# Patient Record
Sex: Male | Born: 1985 | Race: White | Hispanic: No | Marital: Single
Health system: Southern US, Community
[De-identification: ages and names within clinical notes are randomized; demographics above are authoritative.]

## PROBLEM LIST (undated history)

## (undated) DIAGNOSIS — F419 Anxiety disorder, unspecified: Secondary | ICD-10-CM

## (undated) DIAGNOSIS — F32A Depression, unspecified: Secondary | ICD-10-CM

## (undated) HISTORY — PX: WISDOM TOOTH EXTRACTION: SHX21

---

## 2004-12-11 ENCOUNTER — Ambulatory Visit (HOSPITAL_COMMUNITY): Admission: RE | Admit: 2004-12-11 | Discharge: 2004-12-11 | Payer: Self-pay | Admitting: Internal Medicine

## 2008-10-26 ENCOUNTER — Emergency Department (HOSPITAL_COMMUNITY): Admission: EM | Admit: 2008-10-26 | Discharge: 2008-10-26 | Payer: Self-pay | Admitting: Emergency Medicine

## 2010-04-22 IMAGING — CR DG FOOT COMPLETE 3+V*L*
3 series · 3 of 3 positions shown · non-contrast
Comparison: None

CLINICAL DATA: Fell 10/25/2008.  Pain and swelling.

LEFT FOOT - COMPLETE 3+ VIEW

[t foot ap left]
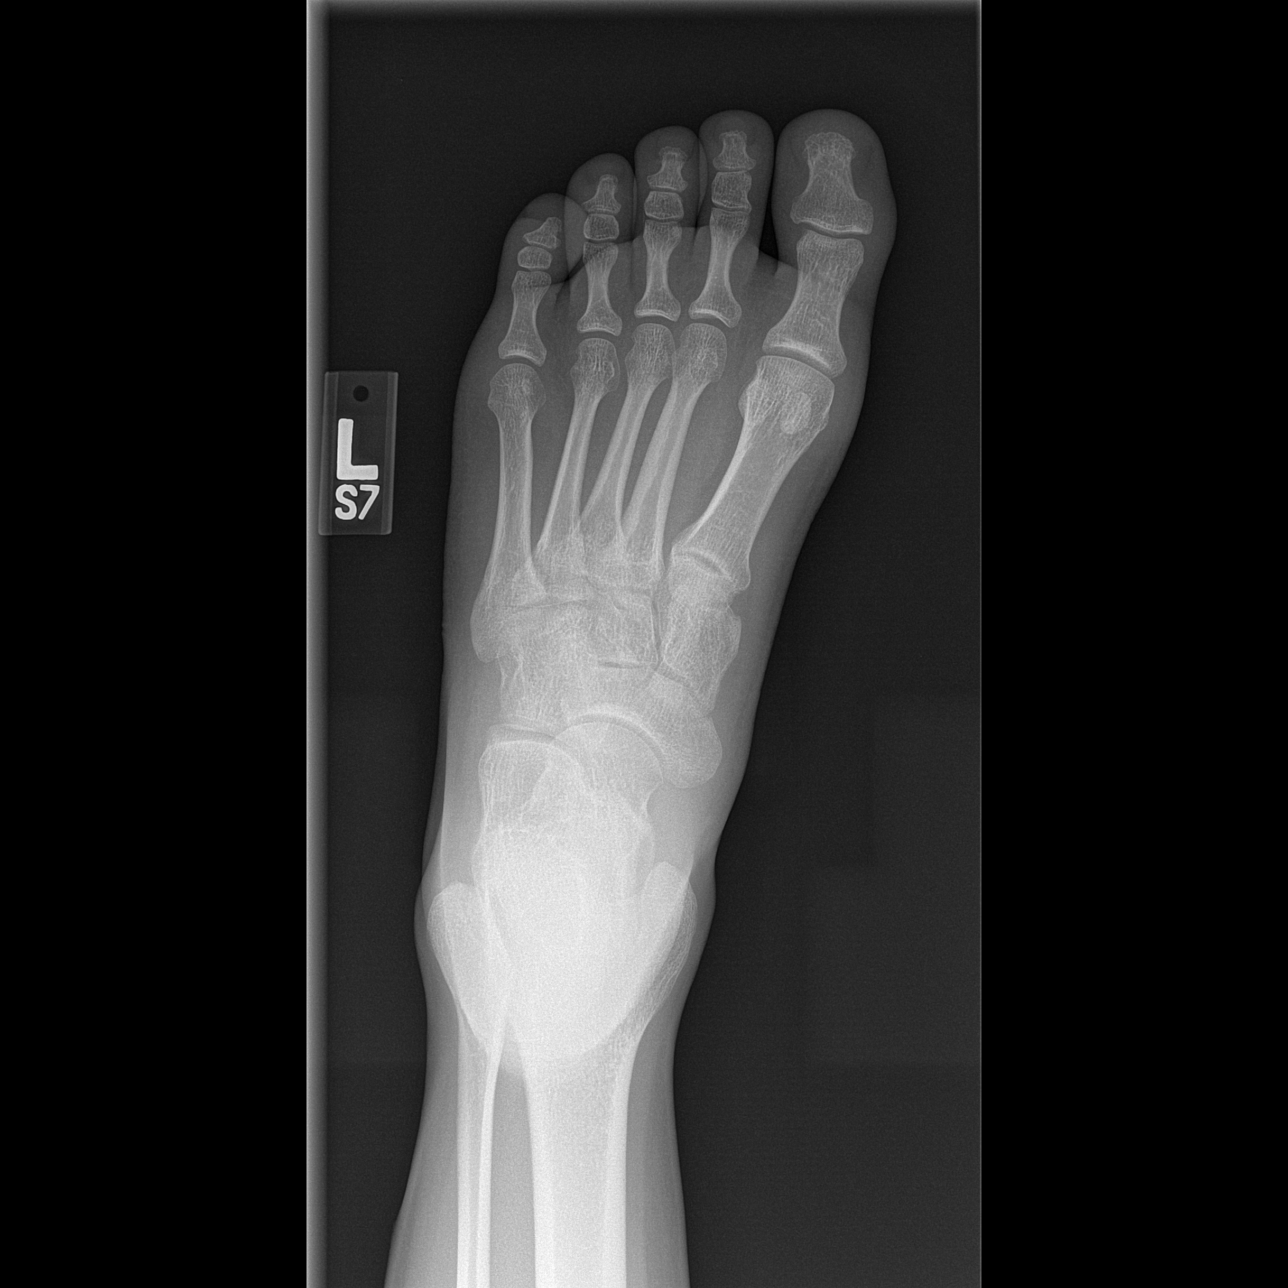

[t foot oblique left]
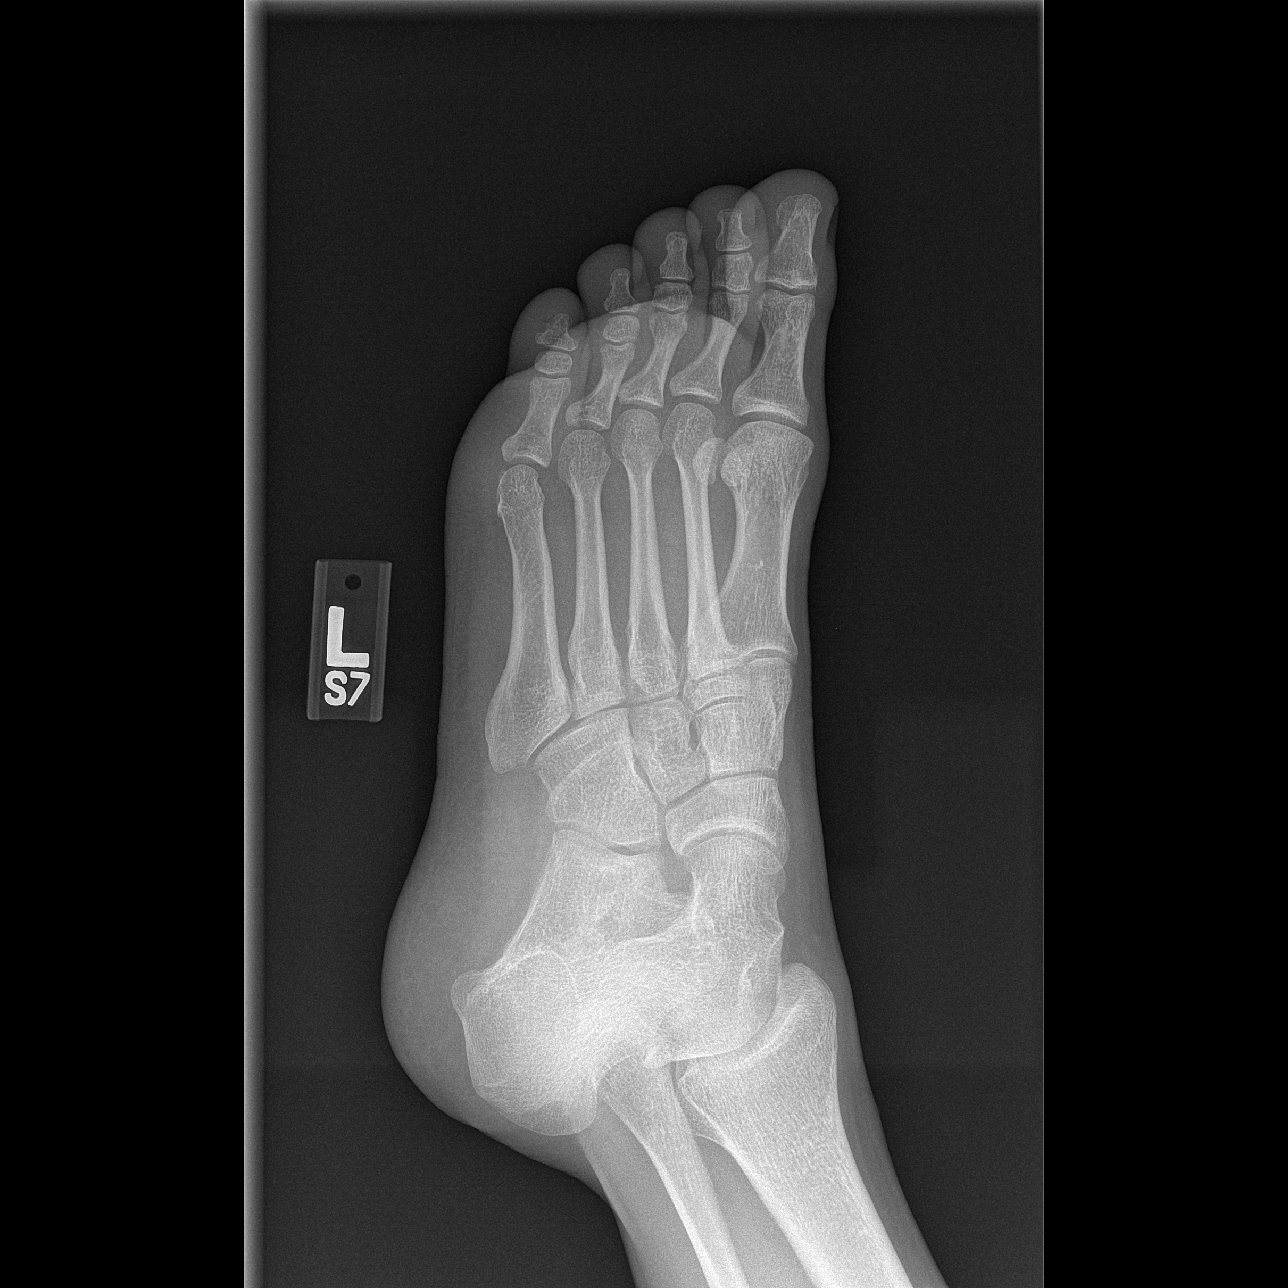

[t foot lat left]
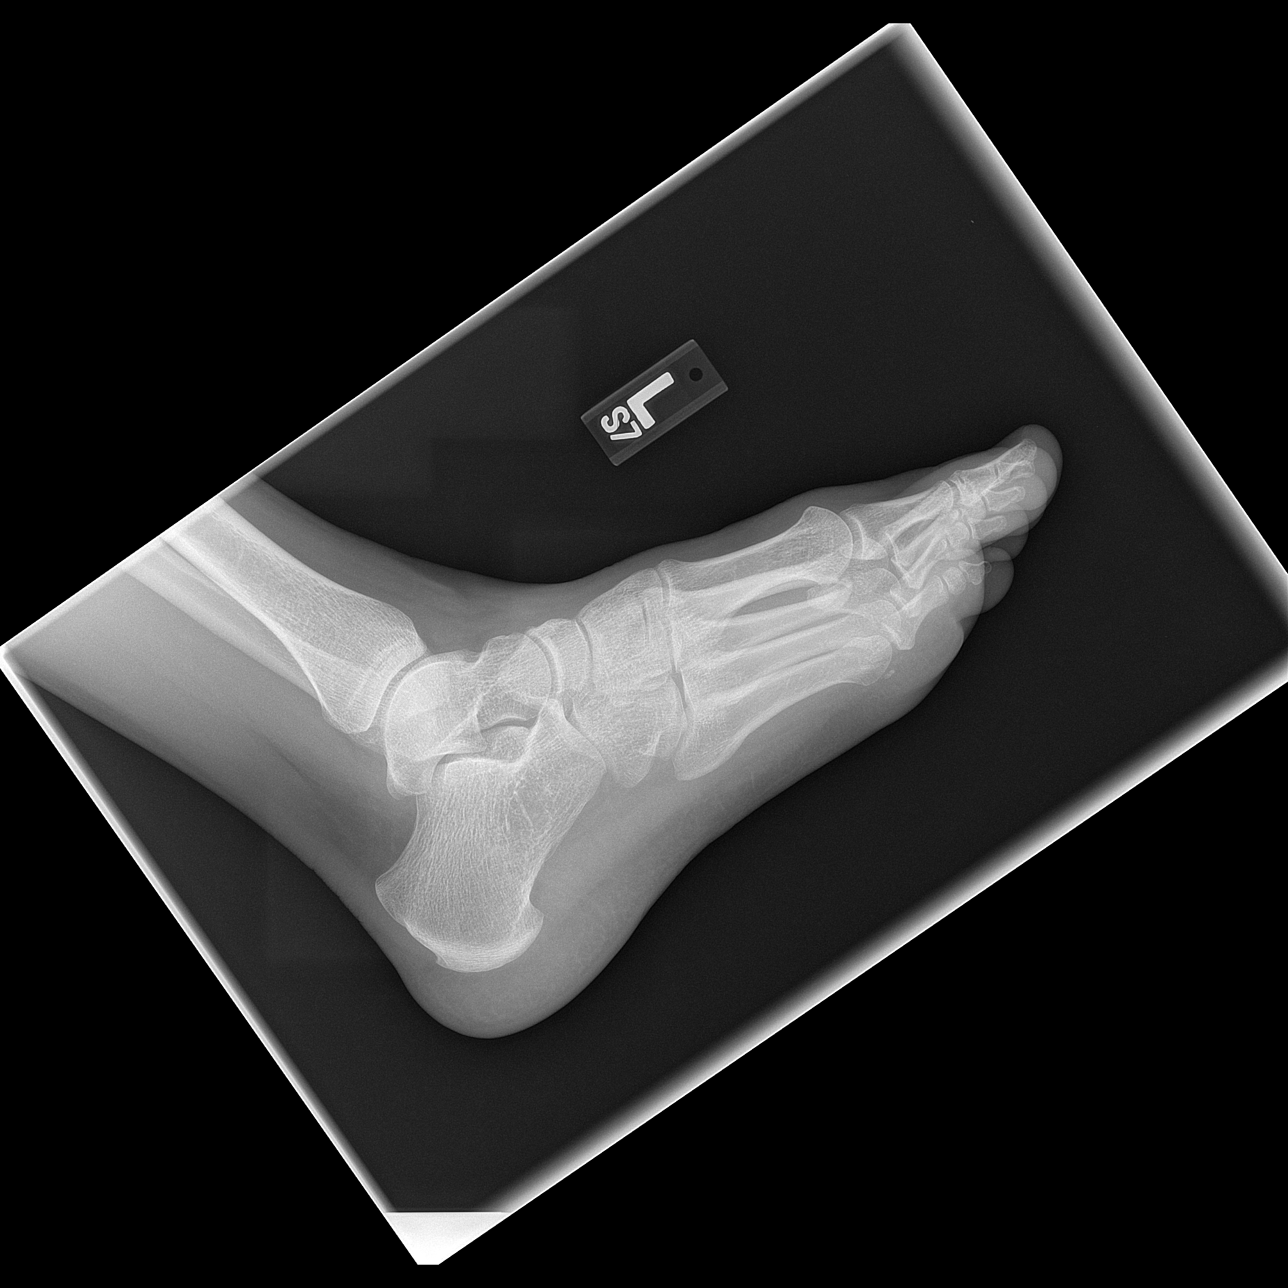

[3 of 3 positions shown; findings below may reference images not displayed]

FINDINGS: No fracture or subluxation.
IMPRESSION: Negative left foot.

## 2011-02-27 ENCOUNTER — Emergency Department (HOSPITAL_COMMUNITY)
Admission: EM | Admit: 2011-02-27 | Discharge: 2011-02-28 | Disposition: A | Discharge status: Home / Self Care | Payer: Self-pay | Attending: Emergency Medicine | Admitting: Emergency Medicine

## 2011-02-27 DIAGNOSIS — J351 Hypertrophy of tonsils: Secondary | ICD-10-CM | POA: Insufficient documentation

## 2011-02-27 DIAGNOSIS — R509 Fever, unspecified: Secondary | ICD-10-CM | POA: Insufficient documentation

## 2011-02-27 DIAGNOSIS — J020 Streptococcal pharyngitis: Secondary | ICD-10-CM | POA: Insufficient documentation

## 2011-02-27 DIAGNOSIS — R350 Frequency of micturition: Secondary | ICD-10-CM | POA: Insufficient documentation

## 2011-02-27 DIAGNOSIS — R109 Unspecified abdominal pain: Secondary | ICD-10-CM | POA: Insufficient documentation

## 2011-02-27 DIAGNOSIS — M549 Dorsalgia, unspecified: Secondary | ICD-10-CM | POA: Insufficient documentation

## 2011-02-28 ENCOUNTER — Emergency Department (HOSPITAL_COMMUNITY): Payer: Self-pay

## 2011-02-28 LAB — POCT I-STAT, CHEM 8
BUN: 10 mg/dL (ref 6–23)
Calcium, Ion: 1.15 mmol/L (ref 1.12–1.32)
Chloride: 101 mEq/L (ref 96–112)
Glucose, Bld: 101 mg/dL — ABNORMAL HIGH (ref 70–99)
Potassium: 3.1 mEq/L — ABNORMAL LOW (ref 3.5–5.1)

## 2011-02-28 LAB — URINALYSIS, ROUTINE W REFLEX MICROSCOPIC
Bilirubin Urine: NEGATIVE
Glucose, UA: NEGATIVE mg/dL
Ketones, ur: 15 mg/dL — AB
Leukocytes, UA: NEGATIVE
Specific Gravity, Urine: 1.03 (ref 1.005–1.030)
pH: 8 (ref 5.0–8.0)

## 2011-02-28 LAB — RAPID STREP SCREEN (MED CTR MEBANE ONLY): Streptococcus, Group A Screen (Direct): POSITIVE — AB

## 2012-08-24 IMAGING — CR DG CHEST 2V
2 series · 2 of 2 positions shown · non-contrast
Comparison: None.

CLINICAL DATA: Fever, abdominal pain, back pain for 3 days.

CHEST - 2 VIEW

[w chest pa]
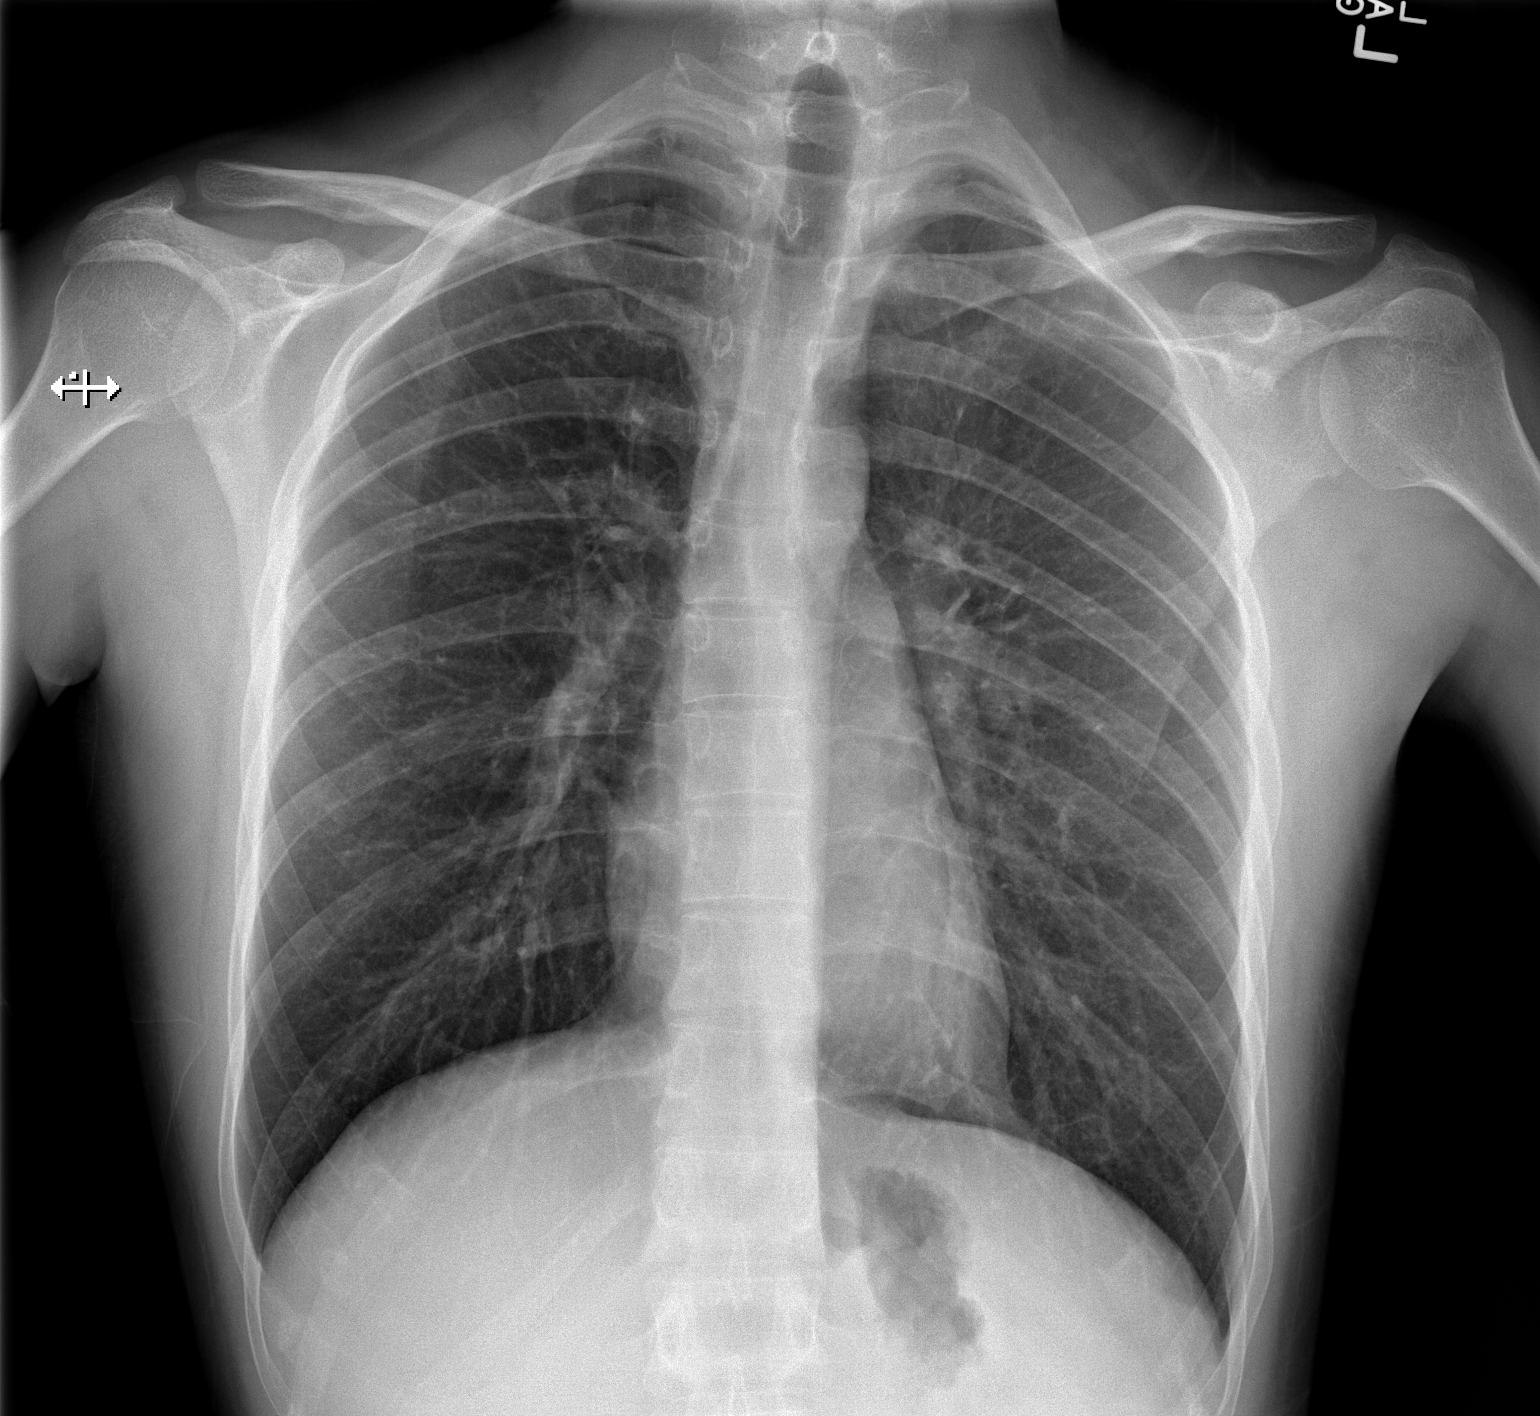

[w chest lat]
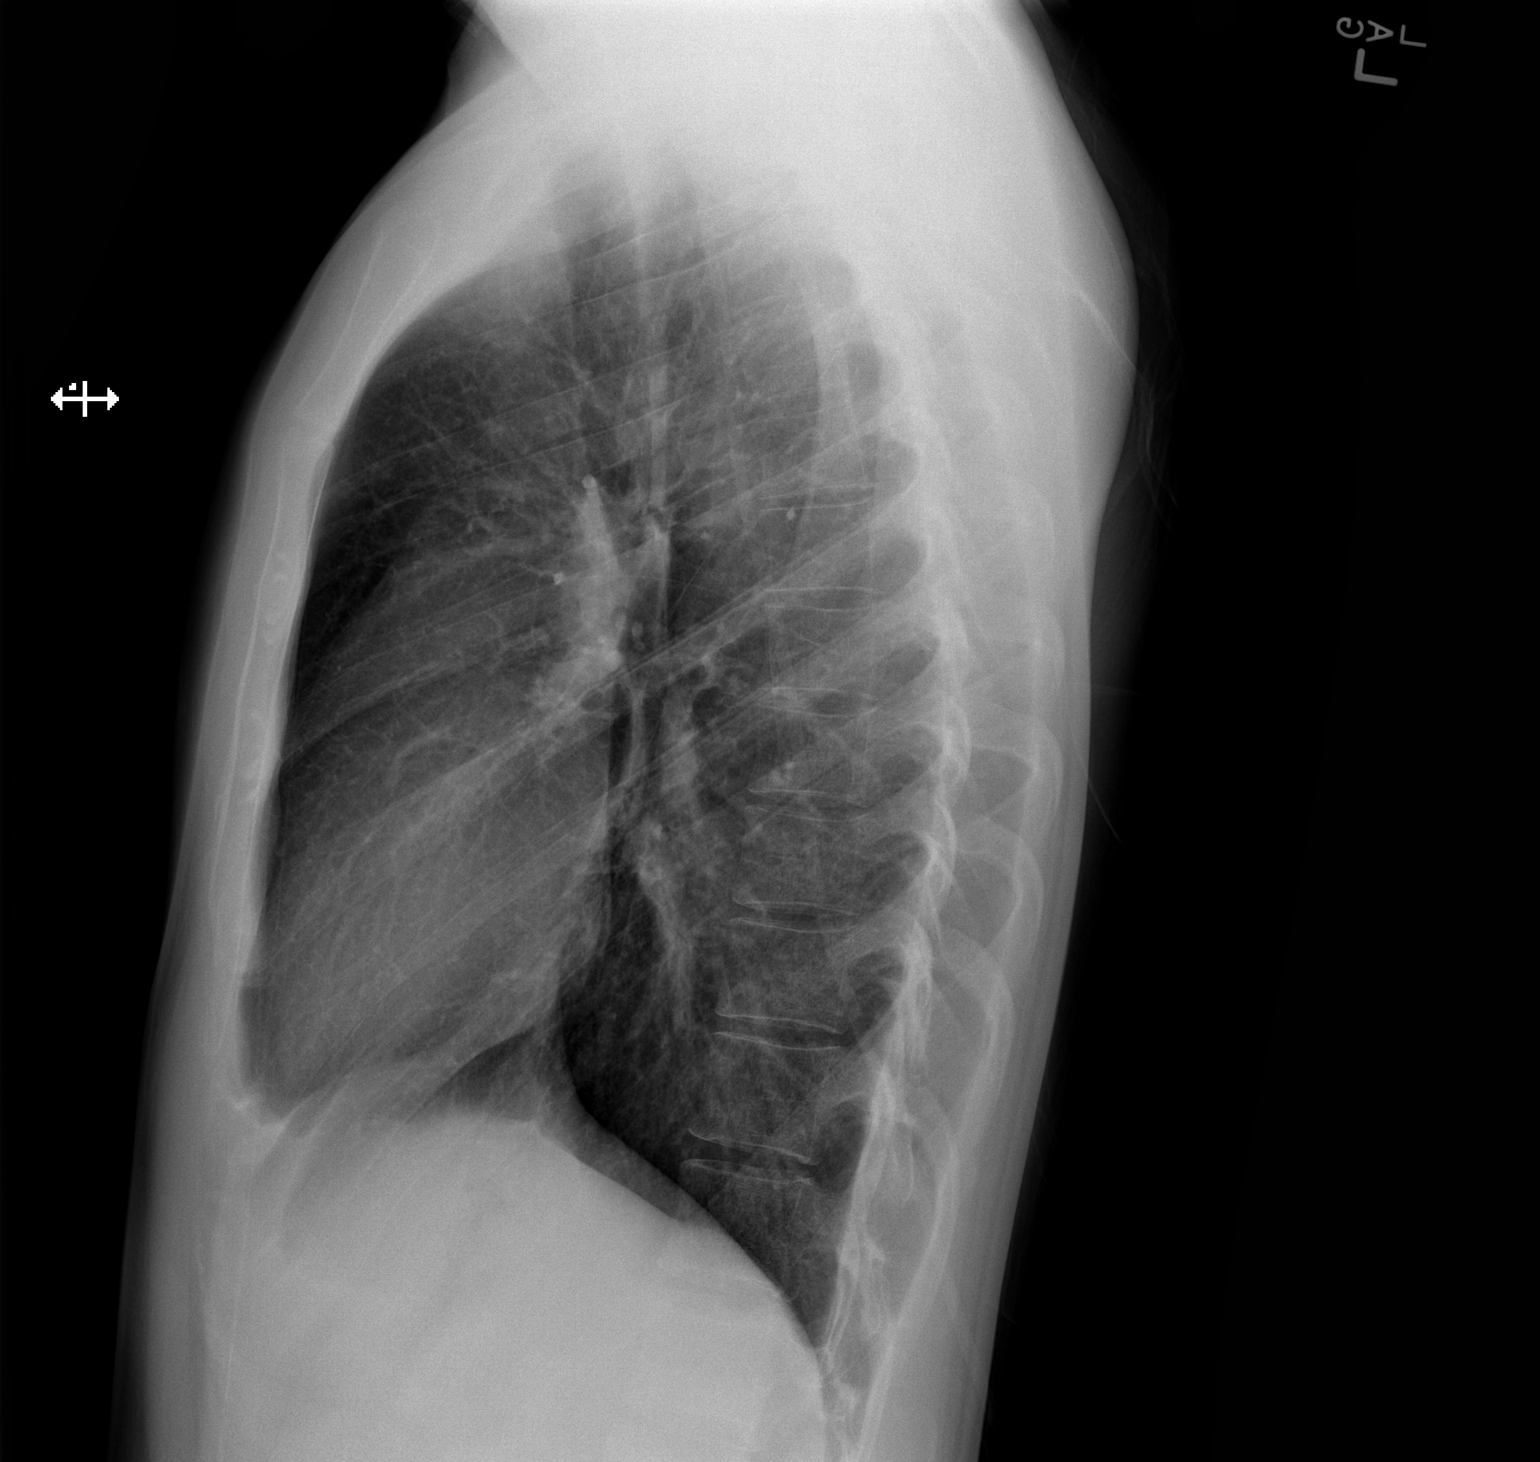

[2 of 2 positions shown; findings below may reference images not displayed]

FINDINGS: Mild hyperinflation.  Mild peribronchial thickening may
represent reactive airways disease.  No focal airspace
consolidation in the lungs.  No blunting of costophrenic angles.
No pneumothorax.
IMPRESSION: Mild hyperinflation with peribronchial thickening may suggest
reactive airways disease.  No focal consolidation.

## 2022-05-10 ENCOUNTER — Emergency Department (HOSPITAL_COMMUNITY): Payer: 59

## 2022-05-10 ENCOUNTER — Other Ambulatory Visit: Payer: Self-pay

## 2022-05-10 ENCOUNTER — Emergency Department (HOSPITAL_COMMUNITY)
Admission: EM | Admit: 2022-05-10 | Discharge: 2022-05-10 | Discharge status: Against Medical Advice | Payer: 59 | Attending: Emergency Medicine | Admitting: Emergency Medicine

## 2022-05-10 DIAGNOSIS — F410 Panic disorder [episodic paroxysmal anxiety] without agoraphobia: Secondary | ICD-10-CM | POA: Insufficient documentation

## 2022-05-10 DIAGNOSIS — R079 Chest pain, unspecified: Secondary | ICD-10-CM | POA: Diagnosis not present

## 2022-05-10 DIAGNOSIS — M7918 Myalgia, other site: Secondary | ICD-10-CM | POA: Insufficient documentation

## 2022-05-10 DIAGNOSIS — Z5321 Procedure and treatment not carried out due to patient leaving prior to being seen by health care provider: Secondary | ICD-10-CM | POA: Diagnosis not present

## 2022-05-10 DIAGNOSIS — R509 Fever, unspecified: Secondary | ICD-10-CM | POA: Insufficient documentation

## 2022-05-10 DIAGNOSIS — R0602 Shortness of breath: Secondary | ICD-10-CM | POA: Diagnosis not present

## 2022-05-10 LAB — CBC WITH DIFFERENTIAL/PLATELET
Abs Immature Granulocytes: 0.04 10*3/uL (ref 0.00–0.07)
Basophils Absolute: 0.1 10*3/uL (ref 0.0–0.1)
Basophils Relative: 0 %
Eosinophils Absolute: 0 10*3/uL (ref 0.0–0.5)
Eosinophils Relative: 0 %
HCT: 48.1 % (ref 39.0–52.0)
Hemoglobin: 17 g/dL (ref 13.0–17.0)
Immature Granulocytes: 0 %
Lymphocytes Relative: 16 %
Lymphs Abs: 2.5 10*3/uL (ref 0.7–4.0)
MCH: 28.6 pg (ref 26.0–34.0)
MCHC: 35.3 g/dL (ref 30.0–36.0)
MCV: 81 fL (ref 80.0–100.0)
Monocytes Absolute: 1.3 10*3/uL — ABNORMAL HIGH (ref 0.1–1.0)
Monocytes Relative: 8 %
Neutro Abs: 11.5 10*3/uL — ABNORMAL HIGH (ref 1.7–7.7)
Neutrophils Relative %: 76 %
Platelets: 326 10*3/uL (ref 150–400)
RBC: 5.94 MIL/uL — ABNORMAL HIGH (ref 4.22–5.81)
RDW: 11.9 % (ref 11.5–15.5)
WBC: 15.4 10*3/uL — ABNORMAL HIGH (ref 4.0–10.5)
nRBC: 0 % (ref 0.0–0.2)

## 2022-05-10 LAB — BASIC METABOLIC PANEL
Anion gap: 18 — ABNORMAL HIGH (ref 5–15)
BUN: 13 mg/dL (ref 6–20)
CO2: 14 mmol/L — ABNORMAL LOW (ref 22–32)
Calcium: 9 mg/dL (ref 8.9–10.3)
Chloride: 104 mmol/L (ref 98–111)
Creatinine, Ser: 1.08 mg/dL (ref 0.61–1.24)
GFR, Estimated: >60 mL/min (ref >60)
Glucose, Bld: 119 mg/dL — ABNORMAL HIGH (ref 70–99)
Potassium: 3.2 mmol/L — ABNORMAL LOW (ref 3.5–5.1)
Sodium: 136 mmol/L (ref 135–145)

## 2022-05-10 LAB — TROPONIN I (HIGH SENSITIVITY): Troponin I (High Sensitivity): <2 ng/L (ref <18)

## 2022-05-10 MED ORDER — LORAZEPAM 1 MG PO TABS
2.0000 mg | ORAL_TABLET | Freq: Once | ORAL | Status: AC
  Administered 2022-05-10: 2 mg via ORAL
  Filled 2022-05-10: qty 2

## 2022-05-10 NOTE — ED Provider Triage Note (Signed)
Emergency Medicine Provider Triage Evaluation Note  Douglas [name] , a 37 y.o. male  was evaluated in triage.  Pt complains of bodyaches that started last night.  He states positive COVID exposure few days ago.  States today he has been having a panic attack.  States his hands have locked up.  He states this happened in the past with a panic attack.  Does have history of anxiety.  States he takes 3 medications but is unsure at this time of what those medications are.  States he previously went to [facility] with similar episode.  I am unable to locate his chart and care everywhere from that visit.  In addition to the generalized body aches he is endorsing some chest pain and shortness of breath.  Review of Systems  Positive: As above Negative: As above  Physical Exam  BP (!) 137/112 (BP Location: Left Arm)   Pulse 95   Temp 97.9 F (36.6 C) (Oral)   Resp (!) 22   Ht 5\' 11"  (1.803 m)   Wt 104 kg   SpO2 100%   BMI 31.98 kg/m  Gen:   Awake, no distress   Resp:  Normal effort  MSK:   Moves extremities without difficulty  Other:    Medical Decision Making  Medically screening exam initiated at 3:28 PM.  Appropriate orders placed.  Douglas [name] was informed that the remainder of the evaluation will be completed by another provider, this initial triage assessment does not replace that evaluation, and the importance of remaining in the ED until their evaluation is complete.  Hyperventilating, appears very anxious, digits of bilateral hands are locked up.  Cogwheel rigidity noted at bilateral elbows.  Full unrestricted range of motion present in bilateral lower extremities.  Will give dose of Ativan.  Will order blood work.   Evlyn Courier, PA-C 05/10/22 1531

## 2022-05-10 NOTE — ED Triage Notes (Signed)
Pt reports fever, chills, body aches. States he feels like he is having an anxiety attack. Covid exposure.

## 2022-05-10 NOTE — ED Notes (Signed)
Called patient for room and no one responded

## 2022-06-17 ENCOUNTER — Emergency Department (HOSPITAL_COMMUNITY)
Admission: EM | Admit: 2022-06-17 | Discharge: 2022-06-17 | Disposition: A | Discharge status: Home / Self Care | Payer: 59 | Attending: Emergency Medicine | Admitting: Emergency Medicine

## 2022-06-17 ENCOUNTER — Encounter (HOSPITAL_COMMUNITY): Payer: Self-pay

## 2022-06-17 DIAGNOSIS — R110 Nausea: Secondary | ICD-10-CM | POA: Insufficient documentation

## 2022-06-17 DIAGNOSIS — R079 Chest pain, unspecified: Secondary | ICD-10-CM | POA: Diagnosis not present

## 2022-06-17 DIAGNOSIS — F419 Anxiety disorder, unspecified: Secondary | ICD-10-CM

## 2022-06-17 DIAGNOSIS — R1013 Epigastric pain: Secondary | ICD-10-CM | POA: Insufficient documentation

## 2022-06-17 HISTORY — DX: Anxiety disorder, unspecified: F41.9

## 2022-06-17 HISTORY — DX: Depression, unspecified: F32.A

## 2022-06-17 LAB — COMPREHENSIVE METABOLIC PANEL
ALT: 24 U/L (ref 0–44)
AST: 32 U/L (ref 15–41)
Albumin: 4.4 g/dL (ref 3.5–5.0)
Alkaline Phosphatase: 53 U/L (ref 38–126)
Anion gap: 10 (ref 5–15)
BUN: 9 mg/dL (ref 6–20)
CO2: 19 mmol/L — ABNORMAL LOW (ref 22–32)
Calcium: 8.9 mg/dL (ref 8.9–10.3)
Chloride: 107 mmol/L (ref 98–111)
Creatinine, Ser: 0.95 mg/dL (ref 0.61–1.24)
GFR, Estimated: >60 mL/min (ref >60)
Glucose, Bld: 100 mg/dL — ABNORMAL HIGH (ref 70–99)
Potassium: 4.2 mmol/L (ref 3.5–5.1)
Sodium: 136 mmol/L (ref 135–145)
Total Bilirubin: 0.8 mg/dL (ref 0.3–1.2)
Total Protein: 7.8 g/dL (ref 6.5–8.1)

## 2022-06-17 LAB — CBC WITH DIFFERENTIAL/PLATELET
Abs Immature Granulocytes: 0 10*3/uL (ref 0.00–0.07)
Basophils Absolute: 0.1 10*3/uL (ref 0.0–0.1)
Basophils Relative: 1 %
Eosinophils Absolute: 0.1 10*3/uL (ref 0.0–0.5)
Eosinophils Relative: 2 %
HCT: 50.1 % (ref 39.0–52.0)
Hemoglobin: 17.2 g/dL — ABNORMAL HIGH (ref 13.0–17.0)
Immature Granulocytes: 0 %
Lymphocytes Relative: 43 %
Lymphs Abs: 2.3 10*3/uL (ref 0.7–4.0)
MCH: 28.5 pg (ref 26.0–34.0)
MCHC: 34.3 g/dL (ref 30.0–36.0)
MCV: 82.9 fL (ref 80.0–100.0)
Monocytes Absolute: 0.4 10*3/uL (ref 0.1–1.0)
Monocytes Relative: 8 %
Neutro Abs: 2.4 10*3/uL (ref 1.7–7.7)
Neutrophils Relative %: 46 %
Platelets: 303 10*3/uL (ref 150–400)
RBC: 6.04 MIL/uL — ABNORMAL HIGH (ref 4.22–5.81)
RDW: 12.6 % (ref 11.5–15.5)
WBC: 5.3 10*3/uL (ref 4.0–10.5)
nRBC: 0 % (ref 0.0–0.2)

## 2022-06-17 LAB — LIPASE, BLOOD: Lipase: 50 U/L (ref 11–51)

## 2022-06-17 MED ORDER — LACTATED RINGERS IV BOLUS
1000.0000 mL | Freq: Once | INTRAVENOUS | Status: AC
  Administered 2022-06-17: 1000 mL via INTRAVENOUS

## 2022-06-17 MED ORDER — DIAZEPAM 5 MG/ML IJ SOLN
2.5000 mg | Freq: Once | INTRAMUSCULAR | Status: AC
  Administered 2022-06-17: 2.5 mg via INTRAVENOUS
  Filled 2022-06-17: qty 2

## 2022-06-17 MED ORDER — LACTATED RINGERS IV SOLN: INTRAVENOUS | Status: DC

## 2022-06-17 MED ORDER — ONDANSETRON HCL 4 MG/2ML IJ SOLN
4.0000 mg | Freq: Once | INTRAMUSCULAR | Status: AC
  Administered 2022-06-17: 4 mg via INTRAVENOUS
  Filled 2022-06-17: qty 2

## 2022-06-17 MED ORDER — HYDROXYZINE HCL 25 MG PO TABS: 25.0000 mg | ORAL_TABLET | Freq: Four times a day (QID) | ORAL | 0 refills | Status: AC

## 2022-06-17 NOTE — ED Triage Notes (Signed)
Pt presents with c/o abdominal pain and chest pain. Pt reports the pain is in the center of his chest. Pt reports that the pain started this morning. Pt reports nausea but no vomiting.

## 2022-06-17 NOTE — ED Notes (Signed)
Anxious, panicky, hyperventilating, rigid and "locked up", c/o numbness and tingling.

## 2022-06-17 NOTE — ED Notes (Signed)
Calmer, breathing easier, NAD, sleeping/ sedated. Fiance at [facility].

## 2022-06-17 NOTE — ED Provider Notes (Signed)
**Douglas [name] De-Identified via Obfuscation** Douglas Douglas [name]   CSN: [id number] Arrival date & time: 06/17/22  [id number]     History  Chief Complaint  Patient presents with   Chest Pain   Abdominal Pain    Douglas Douglas [name] is a 37 y.o. male.  37 year old male with history anxiety disorder presents with epigastric abdominal pain which began today.  Patient states that he has been under a great deal of stress recently.  Saw his PCP 4 days ago for similar symptoms.  Patient has been out of his anxiety medication now for several months.  Denies any SI or HI.  Has had nausea but no vomiting.  Similar to his prior panic attacks.  Has not medicated prior to arrival       Home Medications Prior to Admission medications   Not on File      Allergies    Patient has no known allergies.    Review of Systems   Review of Systems  All other systems reviewed and are negative.   Physical Exam Updated Vital Signs BP (!) 140/114 (BP Location: Right Arm)   Pulse 71   Temp (!) 97 F (36.1 C) (Oral)   Resp 16   Ht 1.778 m (5' 10"$ )   Wt 86.2 kg   SpO2 100%   BMI 27.26 kg/m  Physical Exam Vitals and nursing Douglas [name] reviewed.  Constitutional:      General: He is not in acute distress.    Appearance: Normal appearance. He is well-developed. He is not toxic-appearing.  HENT:     Head: Normocephalic and atraumatic.  Eyes:     General: Lids are normal.     Conjunctiva/sclera: Conjunctivae normal.     Pupils: Pupils are equal, round, and reactive to light.  Neck:     Thyroid: No thyroid mass.     Trachea: No tracheal deviation.  Cardiovascular:     Rate and Rhythm: Normal rate and regular rhythm.     Heart sounds: Normal heart sounds. No murmur heard.    No gallop.  Pulmonary:     Effort: Pulmonary effort is normal. No respiratory distress.     Breath sounds: Normal breath sounds. No stridor. No decreased breath sounds, wheezing, rhonchi or rales.  Abdominal:     General:  There is no distension.     Palpations: Abdomen is soft.     Tenderness: There is no abdominal tenderness. There is no rebound.  Musculoskeletal:        General: No tenderness. Normal range of motion.     Cervical back: Normal range of motion and neck supple.  Skin:    General: Skin is warm and dry.     Findings: No abrasion or rash.  Neurological:     Mental Status: He is alert and oriented to person, place, and time. Mental status is at baseline.     GCS: GCS eye subscore is 4. GCS verbal subscore is 5. GCS motor subscore is 6.     Cranial Nerves: No cranial nerve deficit.     Sensory: No sensory deficit.     Motor: Motor function is intact.  Psychiatric:        Attention and Perception: Attention normal.        Mood and Affect: Mood is anxious.        Speech: Speech is rapid and pressured.        Behavior: Behavior is hyperactive.     ED Results /  Procedures / Treatments   Labs (all labs ordered are listed, but only abnormal results are displayed) Labs Reviewed  LIPASE, BLOOD  COMPREHENSIVE METABOLIC PANEL  CBC WITH DIFFERENTIAL/PLATELET    EKG EKG Interpretation  Date/Time:  Tuesday June 17 2022 09:00:00 EST Ventricular Rate:  60 PR Interval:  154 QRS Duration: 94 QT Interval:  389 QTC Calculation: 389 R Axis:   104 Text Interpretation: Sinus rhythm Atrial premature complexes in couplets Right axis deviation Confirmed by Lacretia Leigh ([id number]) on 06/17/2022 10:23:58 AM  Radiology No results found.  Procedures Procedures    Medications Ordered in ED Medications  ondansetron (ZOFRAN) injection 4 mg (has no administration in time range)  diazepam (VALIUM) injection 2.5 mg (has no administration in time range)  lactated ringers bolus 1,000 mL (has no administration in time range)  lactated ringers infusion (has no administration in time range)    ED Course/ Medical Decision Making/ A&P                             Medical Decision Making Amount and/or  Complexity of Data Reviewed Labs: ordered. ECG/medicine tests: ordered.  Risk Prescription drug management.   Patient is EKG per interpretation shows normal sinus rhythm.  No signs of acute ischemic changes noted.  Patient very anxious here and given IV Valium and feels much better.  No concern for ACS at this time.  Will discharge home        Final Clinical Impression(s) / ED Diagnoses Final diagnoses:  None    Rx / DC Orders ED Discharge Orders     None         Lacretia Leigh, MD 06/17/22 1225
